# Patient Record
Sex: Male | Born: 2000 | Race: Black or African American | Hispanic: No | Marital: Single | Smoking: Never smoker
Health system: Southern US, Community
[De-identification: ages and names within clinical notes are randomized; demographics above are authoritative.]

---

## 2018-11-28 ENCOUNTER — Ambulatory Visit (HOSPITAL_COMMUNITY): Admission: EM | Admit: 2018-11-28 | Discharge: 2018-11-28 | Discharge status: Against Medical Advice | Payer: Self-pay

## 2019-08-11 ENCOUNTER — Emergency Department (HOSPITAL_COMMUNITY): Payer: Medicaid Other

## 2019-08-11 ENCOUNTER — Encounter (HOSPITAL_COMMUNITY): Payer: Self-pay | Admitting: Emergency Medicine

## 2019-08-11 ENCOUNTER — Emergency Department (HOSPITAL_COMMUNITY)
Admission: EM | Admit: 2019-08-11 | Discharge: 2019-08-12 | Disposition: A | Discharge status: Home / Self Care | Payer: Medicaid Other | Attending: Emergency Medicine | Admitting: Emergency Medicine

## 2019-08-11 ENCOUNTER — Other Ambulatory Visit: Payer: Self-pay

## 2019-08-11 DIAGNOSIS — Y999 Unspecified external cause status: Secondary | ICD-10-CM | POA: Insufficient documentation

## 2019-08-11 DIAGNOSIS — S20219A Contusion of unspecified front wall of thorax, initial encounter: Secondary | ICD-10-CM | POA: Insufficient documentation

## 2019-08-11 DIAGNOSIS — Y939 Activity, unspecified: Secondary | ICD-10-CM | POA: Insufficient documentation

## 2019-08-11 DIAGNOSIS — Y929 Unspecified place or not applicable: Secondary | ICD-10-CM | POA: Insufficient documentation

## 2019-08-11 DIAGNOSIS — R0789 Other chest pain: Secondary | ICD-10-CM | POA: Diagnosis present
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
## 2019-08-12 NOTE — ED Notes (Signed)
All appropriate discharge materials reviewed with patient at length. Time for questions provided. Pt denies any further questions at this time. Verbalizes understanding of all provided materials.  

## 2019-08-12 NOTE — Discharge Instructions (Addendum)
Apply ice to sore areas as needed.  Take ibuprofen, naproxen, or acetaminophen as needed for pain.

## 2020-03-25 ENCOUNTER — Emergency Department (HOSPITAL_BASED_OUTPATIENT_CLINIC_OR_DEPARTMENT_OTHER)
Admission: EM | Admit: 2020-03-25 | Discharge: 2020-03-25 | Disposition: A | Discharge status: Home / Self Care | Payer: No Typology Code available for payment source | Attending: Emergency Medicine | Admitting: Emergency Medicine

## 2020-03-25 ENCOUNTER — Other Ambulatory Visit: Payer: Self-pay

## 2020-03-25 ENCOUNTER — Encounter (HOSPITAL_BASED_OUTPATIENT_CLINIC_OR_DEPARTMENT_OTHER): Payer: Self-pay | Admitting: Emergency Medicine

## 2020-03-25 ENCOUNTER — Emergency Department (HOSPITAL_BASED_OUTPATIENT_CLINIC_OR_DEPARTMENT_OTHER): Payer: No Typology Code available for payment source

## 2020-03-25 DIAGNOSIS — S60221A Contusion of right hand, initial encounter: Secondary | ICD-10-CM | POA: Diagnosis not present

## 2020-03-25 DIAGNOSIS — W010XXA Fall on same level from slipping, tripping and stumbling without subsequent striking against object, initial encounter: Secondary | ICD-10-CM | POA: Diagnosis not present

## 2020-03-25 DIAGNOSIS — Y990 Civilian activity done for income or pay: Secondary | ICD-10-CM | POA: Diagnosis not present

## 2020-03-25 DIAGNOSIS — Y9301 Activity, walking, marching and hiking: Secondary | ICD-10-CM | POA: Insufficient documentation

## 2020-03-25 DIAGNOSIS — Z880 Allergy status to penicillin: Secondary | ICD-10-CM | POA: Diagnosis not present

## 2020-03-25 DIAGNOSIS — Y929 Unspecified place or not applicable: Secondary | ICD-10-CM | POA: Diagnosis not present

## 2020-03-25 DIAGNOSIS — S40021A Contusion of right upper arm, initial encounter: Secondary | ICD-10-CM

## 2020-03-25 DIAGNOSIS — S5011XA Contusion of right forearm, initial encounter: Secondary | ICD-10-CM | POA: Diagnosis not present

## 2020-03-25 DIAGNOSIS — S5001XA Contusion of right elbow, initial encounter: Secondary | ICD-10-CM | POA: Diagnosis not present

## 2020-03-25 DIAGNOSIS — S59911A Unspecified injury of right forearm, initial encounter: Secondary | ICD-10-CM | POA: Diagnosis present

## 2020-03-25 NOTE — ED Provider Notes (Signed)
[facility] DEPART[facility] Provider Note  CSN: [id number] Arrival date & time: 03/25/20 1660    History Chief Complaint  Patient presents with  . Fall    HPI  Troy Sullivan is a 19 y.o. male who presents to the emergency room for evaluation after a fall at work.  He states he slipped while carrying some boxes and fell onto his right arm.  He is complaining of pain in the right arm mostly at the mid forearm as well as the ulnar right hand.  He denies any head injury or loss of consciousness.  He has no other reported injuries.   History reviewed. No pertinent past medical history.  History reviewed. No pertinent surgical history.  No family history on file.  Social History   Tobacco Use  . Smoking status: Never Smoker  . Smokeless tobacco: Never Used  Substance Use Topics  . Alcohol use: Not on file  . Drug use: Not on file     Home Medications Prior to Admission medications   Not on File     Allergies    Penicillins   Review of Systems   Review of Systems  Constitutional: Negative for fever.  HENT: Negative for congestion and sore throat.   Respiratory: Negative for cough and shortness of breath.   Cardiovascular: Negative for chest pain.  Gastrointestinal: Negative for abdominal pain, diarrhea, nausea and vomiting.  Genitourinary: Negative for dysuria.  Musculoskeletal: Positive for arthralgias. Negative for myalgias.  Skin: Negative for rash.  Neurological: Negative for headaches.  Psychiatric/Behavioral: Negative for behavioral problems.     Physical Exam BP (!) 154/91 (BP Location: Left Arm)   Pulse 72   Temp 98.2 F (36.8 C) (Oral)   Resp 16   Ht 5\' 5"  (1.651 m)   Wt 62.6 kg   SpO2 99%   BMI 22.96 kg/m   Physical Exam Vitals and nursing note reviewed.  Constitutional:      Appearance: Normal appearance.  HENT:     Head: Normocephalic and atraumatic.     Nose: Nose normal.     Mouth/Throat:     Mouth: Mucous  membranes are moist.  Eyes:     Extraocular Movements: Extraocular movements intact.     Conjunctiva/sclera: Conjunctivae normal.  Cardiovascular:     Rate and Rhythm: Normal rate.     Pulses: Normal pulses.  Pulmonary:     Effort: Pulmonary effort is normal.     Breath sounds: Normal breath sounds.  Abdominal:     General: Abdomen is flat.     Palpations: Abdomen is soft.     Tenderness: There is no abdominal tenderness.  Musculoskeletal:        General: Swelling (Right hand) and tenderness (Tender over the medial right elbow, mid forearm and ulnar right hand.  There is no tenderness over the wrist or snuffbox.) present.     Cervical back: Neck supple.  Skin:    General: Skin is warm and dry.  Neurological:     General: No focal deficit present.     Mental Status: He is alert.  Psychiatric:        Mood and Affect: Mood normal.      ED Results / Procedures / Treatments   Labs (all labs ordered are listed, but only abnormal results are displayed) Labs Reviewed - No data to display  EKG None   Radiology DG Elbow Complete Right  Result Date: 03/25/2020 CLINICAL DATA:  Initial evaluation for acute  trauma, fall. EXAM: RIGHT ELBOW - COMPLETE 3+ VIEW COMPARISON:  None. FINDINGS: There is no evidence of fracture, dislocation, or joint effusion. There is no evidence of arthropathy or other focal bone abnormality. Soft tissues are unremarkable. IMPRESSION: No acute osseous abnormality about the right elbow. Electronically Signed   By: Rise Mu M.D.   On: 03/25/2020 08:23   DG Forearm Right  Result Date: 03/25/2020 CLINICAL DATA:  Initial evaluation for acute trauma, fall. EXAM: RIGHT FOREARM - 2 VIEW COMPARISON:  None. FINDINGS: There is no evidence of fracture or other focal bone lesions. Soft tissues are unremarkable. IMPRESSION: No acute osseous abnormality about the right forearm. Electronically Signed   By: Rise Mu M.D.   On: 03/25/2020 08:27   DG Hand  Complete Right  Result Date: 03/25/2020 CLINICAL DATA:  Initial evaluation for acute trauma, fall. EXAM: RIGHT HAND - COMPLETE 3+ VIEW COMPARISON:  None. FINDINGS: There is no evidence of fracture or dislocation. There is no evidence of arthropathy or other focal bone abnormality. Soft tissues are unremarkable. IMPRESSION: No acute osseous abnormality about the right hand. Electronically Signed   By: Rise Mu M.D.   On: 03/25/2020 08:25    Procedures Procedures  Medications Ordered in the ED Medications - No data to display   ED Course  I have reviewed the triage vital signs and the nursing notes.  Pertinent labs & imaging results that were available during my care of the patient were reviewed by me and considered in my medical decision making (see chart for details).  Clinical Course as of Mar 26 835  Wynelle Link Mar 25, 2020  4431 Xrays reviewed, neg for fracture. Patient advised to ice his arm, Motrin/APAP for comfort. He will followup with his workplace's [employer] provider to be cleared to return to work.    [CS]    Clinical Course User Index [CS] Pollyann Savoy, MD    MDM Rules/Calculators/A&P MDM  Final Clinical Impression(s) / ED Diagnoses Final diagnoses:  Contusion of multiple sites of right upper extremity, initial encounter    Rx / DC Orders ED Discharge Orders    None       Pollyann Savoy, MD 03/25/20 [phone]

## 2020-03-25 NOTE — ED Triage Notes (Signed)
States," I was at work and I was carrying 4 crates of strawberries and I slipped on blueberries that were on the floor" No LOC, fell on back and tried to catch himself with his right  arm, now causing pain with movement from elbow to wrist, no obvious deformity noted.

## 2020-05-17 DIAGNOSIS — Z419 Encounter for procedure for purposes other than remedying health state, unspecified: Secondary | ICD-10-CM | POA: Diagnosis not present

## 2020-06-17 DIAGNOSIS — Z419 Encounter for procedure for purposes other than remedying health state, unspecified: Secondary | ICD-10-CM | POA: Diagnosis not present

## 2020-07-18 DIAGNOSIS — Z419 Encounter for procedure for purposes other than remedying health state, unspecified: Secondary | ICD-10-CM | POA: Diagnosis not present

## 2020-08-17 DIAGNOSIS — Z419 Encounter for procedure for purposes other than remedying health state, unspecified: Secondary | ICD-10-CM | POA: Diagnosis not present

## 2020-09-17 DIAGNOSIS — Z419 Encounter for procedure for purposes other than remedying health state, unspecified: Secondary | ICD-10-CM | POA: Diagnosis not present

## 2020-10-17 DIAGNOSIS — Z419 Encounter for procedure for purposes other than remedying health state, unspecified: Secondary | ICD-10-CM | POA: Diagnosis not present

## 2020-11-17 DIAGNOSIS — Z419 Encounter for procedure for purposes other than remedying health state, unspecified: Secondary | ICD-10-CM | POA: Diagnosis not present

## 2020-12-18 DIAGNOSIS — Z419 Encounter for procedure for purposes other than remedying health state, unspecified: Secondary | ICD-10-CM | POA: Diagnosis not present

## 2021-01-15 DIAGNOSIS — Z419 Encounter for procedure for purposes other than remedying health state, unspecified: Secondary | ICD-10-CM | POA: Diagnosis not present

## 2021-02-15 DIAGNOSIS — Z419 Encounter for procedure for purposes other than remedying health state, unspecified: Secondary | ICD-10-CM | POA: Diagnosis not present

## 2021-02-25 ENCOUNTER — Encounter (HOSPITAL_COMMUNITY): Payer: Self-pay

## 2021-02-25 ENCOUNTER — Ambulatory Visit (HOSPITAL_COMMUNITY)
Admission: EM | Admit: 2021-02-25 | Discharge: 2021-02-25 | Disposition: A | Discharge status: Home / Self Care | Payer: Medicaid Other | Attending: Student | Admitting: Student

## 2021-02-25 ENCOUNTER — Other Ambulatory Visit: Payer: Self-pay

## 2021-02-25 ENCOUNTER — Ambulatory Visit (INDEPENDENT_AMBULATORY_CARE_PROVIDER_SITE_OTHER): Payer: Medicaid Other

## 2021-02-25 DIAGNOSIS — M25562 Pain in left knee: Secondary | ICD-10-CM

## 2021-02-25 DIAGNOSIS — M419 Scoliosis, unspecified: Secondary | ICD-10-CM | POA: Diagnosis not present

## 2021-02-25 DIAGNOSIS — M546 Pain in thoracic spine: Secondary | ICD-10-CM | POA: Diagnosis not present

## 2021-02-25 DIAGNOSIS — S8002XA Contusion of left knee, initial encounter: Secondary | ICD-10-CM

## 2021-02-25 DIAGNOSIS — S29012A Strain of muscle and tendon of back wall of thorax, initial encounter: Secondary | ICD-10-CM

## 2021-02-25 MED ORDER — TIZANIDINE HCL 2 MG PO CAPS: 2.0000 mg | ORAL_CAPSULE | Freq: Three times a day (TID) | ORAL | 0 refills | Status: AC
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
## 2021-02-25 NOTE — Discharge Instructions (Addendum)
-  Start the muscle relaxer-Zanaflex (tizanidine), up to 3 times daily for muscle spasms and pain.  This can make you drowsy, so take at bedtime or when you do not need to drive or operate machinery. -Keep your knee brace on while you are having pain, especially when walking around.  Also try putting ice on the knee, elevate your leg at the end of the day, and Tylenol/ibuprofen for relief. -Seek additional medical attention if your symptoms worsen or persist, or you develop new symptoms like abdominal pain, weakness or sensation changes in your arms or legs.

## 2021-03-17 DIAGNOSIS — Z419 Encounter for procedure for purposes other than remedying health state, unspecified: Secondary | ICD-10-CM | POA: Diagnosis not present

## 2021-04-17 DIAGNOSIS — Z419 Encounter for procedure for purposes other than remedying health state, unspecified: Secondary | ICD-10-CM | POA: Diagnosis not present

## 2021-05-17 DIAGNOSIS — Z419 Encounter for procedure for purposes other than remedying health state, unspecified: Secondary | ICD-10-CM | POA: Diagnosis not present
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
## 2021-06-17 DIAGNOSIS — Z419 Encounter for procedure for purposes other than remedying health state, unspecified: Secondary | ICD-10-CM | POA: Diagnosis not present

## 2021-07-18 DIAGNOSIS — Z419 Encounter for procedure for purposes other than remedying health state, unspecified: Secondary | ICD-10-CM | POA: Diagnosis not present

## 2021-08-17 DIAGNOSIS — Z419 Encounter for procedure for purposes other than remedying health state, unspecified: Secondary | ICD-10-CM | POA: Diagnosis not present

## 2021-09-17 DIAGNOSIS — Z419 Encounter for procedure for purposes other than remedying health state, unspecified: Secondary | ICD-10-CM | POA: Diagnosis not present

## 2021-10-17 DIAGNOSIS — Z419 Encounter for procedure for purposes other than remedying health state, unspecified: Secondary | ICD-10-CM | POA: Diagnosis not present

## 2021-10-24 DIAGNOSIS — M546 Pain in thoracic spine: Secondary | ICD-10-CM | POA: Diagnosis not present

## 2021-10-24 DIAGNOSIS — M545 Low back pain, unspecified: Secondary | ICD-10-CM | POA: Diagnosis not present

## 2021-10-29 DIAGNOSIS — M545 Low back pain, unspecified: Secondary | ICD-10-CM | POA: Diagnosis not present

## 2021-10-29 DIAGNOSIS — M546 Pain in thoracic spine: Secondary | ICD-10-CM | POA: Diagnosis not present

## 2021-11-05 DIAGNOSIS — M546 Pain in thoracic spine: Secondary | ICD-10-CM | POA: Diagnosis not present

## 2021-11-05 DIAGNOSIS — M545 Low back pain, unspecified: Secondary | ICD-10-CM | POA: Diagnosis not present

## 2021-11-14 DIAGNOSIS — M545 Low back pain, unspecified: Secondary | ICD-10-CM | POA: Diagnosis not present

## 2021-11-14 DIAGNOSIS — M546 Pain in thoracic spine: Secondary | ICD-10-CM | POA: Diagnosis not present

## 2021-11-17 DIAGNOSIS — Z419 Encounter for procedure for purposes other than remedying health state, unspecified: Secondary | ICD-10-CM | POA: Diagnosis not present

## 2021-11-26 DIAGNOSIS — M546 Pain in thoracic spine: Secondary | ICD-10-CM | POA: Diagnosis not present

## 2021-11-26 DIAGNOSIS — M545 Low back pain, unspecified: Secondary | ICD-10-CM | POA: Diagnosis not present

## 2021-12-18 DIAGNOSIS — Z419 Encounter for procedure for purposes other than remedying health state, unspecified: Secondary | ICD-10-CM | POA: Diagnosis not present

## 2022-01-15 DIAGNOSIS — Z419 Encounter for procedure for purposes other than remedying health state, unspecified: Secondary | ICD-10-CM | POA: Diagnosis not present

## 2022-02-15 DIAGNOSIS — Z419 Encounter for procedure for purposes other than remedying health state, unspecified: Secondary | ICD-10-CM | POA: Diagnosis not present

## 2022-03-17 DIAGNOSIS — Z419 Encounter for procedure for purposes other than remedying health state, unspecified: Secondary | ICD-10-CM | POA: Diagnosis not present

## 2022-04-17 DIAGNOSIS — Z419 Encounter for procedure for purposes other than remedying health state, unspecified: Secondary | ICD-10-CM | POA: Diagnosis not present

## 2022-05-17 DIAGNOSIS — Z419 Encounter for procedure for purposes other than remedying health state, unspecified: Secondary | ICD-10-CM | POA: Diagnosis not present

## 2022-06-17 DIAGNOSIS — Z419 Encounter for procedure for purposes other than remedying health state, unspecified: Secondary | ICD-10-CM | POA: Diagnosis not present

## 2022-07-18 DIAGNOSIS — Z419 Encounter for procedure for purposes other than remedying health state, unspecified: Secondary | ICD-10-CM | POA: Diagnosis not present

## 2022-08-17 DIAGNOSIS — Z419 Encounter for procedure for purposes other than remedying health state, unspecified: Secondary | ICD-10-CM | POA: Diagnosis not present

## 2022-09-17 DIAGNOSIS — Z419 Encounter for procedure for purposes other than remedying health state, unspecified: Secondary | ICD-10-CM | POA: Diagnosis not present

## 2022-10-17 DIAGNOSIS — Z419 Encounter for procedure for purposes other than remedying health state, unspecified: Secondary | ICD-10-CM | POA: Diagnosis not present

## 2022-11-17 DIAGNOSIS — Z419 Encounter for procedure for purposes other than remedying health state, unspecified: Secondary | ICD-10-CM | POA: Diagnosis not present
